# Patient Record
Sex: Male | Born: 1975 | Race: White | Hispanic: No | Marital: Married | Smoking: Never smoker
Health system: Southern US, Community
[De-identification: ages and names within clinical notes are randomized; demographics above are authoritative.]

## PROBLEM LIST (undated history)

## (undated) DIAGNOSIS — M5030 Other cervical disc degeneration, unspecified cervical region: Secondary | ICD-10-CM

## (undated) DIAGNOSIS — G709 Myoneural disorder, unspecified: Secondary | ICD-10-CM

## (undated) DIAGNOSIS — M1990 Unspecified osteoarthritis, unspecified site: Secondary | ICD-10-CM

## (undated) DIAGNOSIS — G4730 Sleep apnea, unspecified: Secondary | ICD-10-CM

## (undated) DIAGNOSIS — I10 Essential (primary) hypertension: Secondary | ICD-10-CM

## (undated) DIAGNOSIS — C801 Malignant (primary) neoplasm, unspecified: Secondary | ICD-10-CM

---

## 2003-12-26 ENCOUNTER — Ambulatory Visit (HOSPITAL_COMMUNITY): Admission: RE | Admit: 2003-12-26 | Discharge: 2003-12-27 | Payer: Self-pay | Admitting: Neurosurgery

## 2005-02-16 ENCOUNTER — Ambulatory Visit (HOSPITAL_COMMUNITY): Admission: RE | Admit: 2005-02-16 | Discharge: 2005-02-16 | Payer: Self-pay | Admitting: Neurosurgery

## 2007-12-22 ENCOUNTER — Emergency Department: Payer: Self-pay | Admitting: Emergency Medicine

## 2010-07-13 ENCOUNTER — Encounter (HOSPITAL_COMMUNITY)
Admission: RE | Admit: 2010-07-13 | Discharge: 2010-07-13 | Disposition: A | Discharge status: Home / Self Care | Payer: Worker's Compensation | Source: Ambulatory Visit | Attending: Neurosurgery | Admitting: Neurosurgery

## 2010-07-13 DIAGNOSIS — Z01812 Encounter for preprocedural laboratory examination: Secondary | ICD-10-CM | POA: Insufficient documentation

## 2010-07-13 LAB — BASIC METABOLIC PANEL
CO2: 27 mEq/L (ref 19–32)
Calcium: 10.1 mg/dL (ref 8.4–10.5)
Creatinine, Ser: 1.09 mg/dL (ref 0.4–1.5)
GFR calc Af Amer: >60 mL/min (ref >60)
GFR calc non Af Amer: >60 mL/min (ref >60)
Glucose, Bld: 105 mg/dL — ABNORMAL HIGH (ref 70–99)

## 2010-07-13 LAB — CBC
HCT: 44.3 % (ref 39.0–52.0)
Hemoglobin: 15.1 g/dL (ref 13.0–17.0)
MCH: 29 pg (ref 26.0–34.0)
MCHC: 34.1 g/dL (ref 30.0–36.0)
MCV: 85.2 fL (ref 78.0–100.0)
Platelets: 302 10*3/uL (ref 150–400)

## 2010-07-13 LAB — SURGICAL PCR SCREEN: Staphylococcus aureus: NEGATIVE

## 2010-07-18 ENCOUNTER — Ambulatory Visit (HOSPITAL_COMMUNITY)
Admission: RE | Admit: 2010-07-18 | Discharge: 2010-07-18 | Disposition: A | Discharge status: Home / Self Care | Payer: Worker's Compensation | Source: Ambulatory Visit | Attending: Neurosurgery | Admitting: Neurosurgery

## 2010-07-18 ENCOUNTER — Other Ambulatory Visit (HOSPITAL_COMMUNITY): Payer: Self-pay | Admitting: Neurosurgery

## 2010-07-18 ENCOUNTER — Inpatient Hospital Stay (HOSPITAL_COMMUNITY)
Admission: RE | Admit: 2010-07-18 | Discharge: 2010-07-19 | DRG: 472 | Disposition: A | Discharge status: Home / Self Care | Payer: Worker's Compensation | Source: Ambulatory Visit | Attending: Neurosurgery | Admitting: Neurosurgery

## 2010-07-18 ENCOUNTER — Ambulatory Visit (HOSPITAL_COMMUNITY): Payer: Worker's Compensation

## 2010-07-18 DIAGNOSIS — M5000 Cervical disc disorder with myelopathy, unspecified cervical region: Principal | ICD-10-CM | POA: Diagnosis present

## 2010-07-18 DIAGNOSIS — S13151A Dislocation of C4/C5 cervical vertebrae, initial encounter: Secondary | ICD-10-CM | POA: Diagnosis present

## 2010-07-18 DIAGNOSIS — M4830 Traumatic spondylopathy, site unspecified: Secondary | ICD-10-CM | POA: Diagnosis present

## 2010-07-18 DIAGNOSIS — M542 Cervicalgia: Secondary | ICD-10-CM

## 2010-07-18 DIAGNOSIS — S13161A Dislocation of C5/C6 cervical vertebrae, initial encounter: Secondary | ICD-10-CM | POA: Diagnosis present

## 2010-07-18 DIAGNOSIS — Z981 Arthrodesis status: Secondary | ICD-10-CM

## 2010-07-18 DIAGNOSIS — X58XXXA Exposure to other specified factors, initial encounter: Secondary | ICD-10-CM | POA: Diagnosis present

## 2010-07-19 NOTE — Op Note (Signed)
Rodney [name], Rodney [name]              ACCOUNT NO.:  [id number]  MEDICAL RECORD NO.:  [id number]           PATIENT TYPE:  I  LOCATION:  3526                         FACILITY:  [facility]  PHYSICIAN:  Cristi Loron, M.D.DATE OF BIRTH:  [birth year]  DATE OF PROCEDURE:  07/18/2010 DATE OF DISCHARGE:                              OPERATIVE REPORT   BRIEF HISTORY:  The patient is a 35 year old white male who I performed a C6-7 anterior cervical diskectomy and fusion plating years ago.  The patient initially did well, but was involved in a work-related injury suffering recurrence of his neck and arm pain.  He failed medical management, was worked up with a cervical MRI which demonstrated he had significant herniated disk, spondylosis, and stenosis at C4-5 and C5-6. I discussed the various treatment options with the patient and I recommended that he undergo the C4-5, C5-6 anterior cervical diskectomy and fusion plating.  The patient has weighed the risks, benefits, and alternatives of the surgery, decided to proceed with the operation.  PREOPERATIVE DIAGNOSES:  C4-5 and C5-6 herniated nucleus pulposus, spondylosis, stenosis, cervical myelopathy/radiculopathy, cervicalgia.  POSTOPERATIVE DIAGNOSES:  C4-5 and C5-6 herniated nucleus pulposus, spondylosis, stenosis, cervical myelopathy/radiculopathy, cervicalgia.  PROCEDURE:  C4-5 and C5-6 extensive anterior cervical diskectomy/decompression; C4-5 and C5-6 anterior interbody arthrodesis with local morselized autograft bone and Actifuse bone graft extender; insertion of C4-5 and C5-6 interbody prosthesis (Zimmer PEEK interbody prosthesis); C4-C6 anterior cervical instrumentation with Globus titanium plate and screws.  SURGEON:  Cristi Loron, M.D.  ASSISTANT:  Hewitt Shorts, M.D.  ANESTHESIA:  General endotracheal.  ESTIMATED BLOOD LOSS:  100 mL.  SPECIMENS:  None.  DRAINS:  None.  COMPLICATIONS:  None.  DESCRIPTION OF THE  PROCEDURE:  The patient was brought to the operating room by the anesthesia team.  General endotracheal anesthesia was induced.  The patient remained in supine position.  A roll was placed under the shoulders to keep his neck in the neutral alignment.  His anterior cervical region was then prepared with Betadine scrub and Betadine solution.  Sterile drapes were applied.  I then injected the area to be incised with Marcaine with epinephrine solution.  A scalpel to make a transverse incision on the patient's left anterior neck.  I used a Metzenbaum scissors to divide the platysma muscle and then to dissect the medial to sternocleidomastoid muscle, jugular vein, and carotid artery.  We did encounter some scar tissue from the prior operation.  We carefully dissected through it.  We identified the esophagus and retracted it medially.  We then used Kittner swabs to clear the soft tissue from the anterior cervical spine.  We identified the anterior cervical plate at Z6-1 confirming our location.  We incised the C5-6 disk with a 15 blade scalpel and performed a partial intervertebral diskectomy with the pituitary forceps and Carlen curettes.  We then inserted distraction screws into the C5 vertebral body.  We removed one of the screws from the old plate at W9-6 and placed a second distraction screw through this hole on the plate.  We did distract the interspace at C5-6.  We then used a  high-speed drill to decorticate the vertebral endplates at C5-6, to drill away the remainder of C5-6 intervertebral disk, to drill away some posterior spondylosis, and to thin out the posterior longitudinal ligament.  We then incised the ligament with Arachnoid Knife and removed it with Kerrison punch undercutting the vertebral endplates at C5-6 decompressing the thecal sac.  We then performed wide foraminotomies about the bilateral C6 nerve root completing the decompression at this level.  There was quite a bit of  spondylosis here.  We then repeated the procedure in analogous fashion at C4-5 decompressing the C4-5 thecal sac and C5 nerve root.  Of note, at this level there was more of an acute i.e., soft ruptured disk.  We got a good decompression at both levels.  We now turned our attention to the arthrodesis.  We used trial spacers and determined to use an 8 x 14 x 14 mm PEEK interbody prosthesis at C4- 5 and a 7 x 14 x 14 at C5-6.  We prefilled the prosthesis with combination of local morselized autograft bone that we obtained during the decompression as well as Actifuse bone graft extender.  We inserted the prosthesis into the distracted interspaces.  We then removed the distraction screws.  There was a good snug fit of the prosthesis at both levels.  We now turned our attention to the anterior spinal instrumentation.  We used high-speed drill to remove some ventral spondylosis from the vertebral endplates at C4-5 and C5-6, so that the plate would lie down flat.  We selected appropriate length Globus anterior plate and laid along the anterior aspect of the vertebral bodies from C4-C6.  We then drilled two 14-mm holes at C4, C5, and C6 (We were able to place the plate in tandem at the old plate at C6).  We then secured the plate to the vertebral bodies by placing two 12-mm self-tapping screws at C4, C5, and C6.  We got good bony purchase.  We then obtained intraoperative radiograph.  There was limited visualization because of the patient's body habitus and shoulder, but the construct looked good in vivo.  We therefore secured the screws to the plate by locking each cam.  This completed the instrumentation.  We then obtained hemostasis using bipolar electrocautery.  We irrigated the wound out with bacitracin solution.  We then removed the retractors. We inspected the esophagus for any damage, there was none apparent.  We then reapproximated the patient's platysma muscle with interrupted  3-0 Vicryl suture, subcutaneous tissue with interrupted 3-0 Vicryl suture, and the skin with Steri-Strips and Benzoin.  The wound was then coated with bacitracin ointment.  A sterile dressing was applied.  The drapes were removed and the patient was subsequently extubated by the anesthesia team and transported to postanesthesia care unit in stable condition.  All sponge, instrument, and needle counts were correct at the end of this case.     Cristi Loron, M.D.    JDJ/MEDQ  D:  07/18/2010  T:  07/19/2010  Job:  [id number]  Electronically Signed by Tressie Stalker M.D. on 07/19/2010 12:23:11 PM

## 2010-07-22 ENCOUNTER — Emergency Department (HOSPITAL_COMMUNITY): Payer: Worker's Compensation

## 2010-07-22 ENCOUNTER — Inpatient Hospital Stay (HOSPITAL_COMMUNITY)
Admission: EM | Admit: 2010-07-22 | Discharge: 2010-07-24 | DRG: 392 | Disposition: A | Discharge status: Home / Self Care | Payer: Worker's Compensation | Attending: Neurosurgery | Admitting: Neurosurgery

## 2010-07-22 DIAGNOSIS — M549 Dorsalgia, unspecified: Secondary | ICD-10-CM | POA: Diagnosis present

## 2010-07-22 DIAGNOSIS — R1313 Dysphagia, pharyngeal phase: Principal | ICD-10-CM | POA: Diagnosis present

## 2010-07-22 DIAGNOSIS — Z981 Arthrodesis status: Secondary | ICD-10-CM

## 2010-07-22 LAB — BASIC METABOLIC PANEL
BUN: 11 mg/dL (ref 6–23)
Calcium: 9.8 mg/dL (ref 8.4–10.5)
Creatinine, Ser: 0.97 mg/dL (ref 0.4–1.5)
GFR calc Af Amer: >60 mL/min (ref >60)
Sodium: 135 mEq/L (ref 135–145)

## 2010-07-22 LAB — CBC
HCT: 41.4 % (ref 39.0–52.0)
Hemoglobin: 14.1 g/dL (ref 13.0–17.0)
MCH: 28.4 pg (ref 26.0–34.0)
MCHC: 34.1 g/dL (ref 30.0–36.0)
MCV: 83.3 fL (ref 78.0–100.0)
Platelets: 328 10*3/uL (ref 150–400)
RDW: 13.1 % (ref 11.5–15.5)

## 2010-07-22 LAB — DIFFERENTIAL
Eosinophils Absolute: 0.1 10*3/uL (ref 0.0–0.7)
Neutro Abs: 11.5 10*3/uL — ABNORMAL HIGH (ref 1.7–7.7)

## 2010-07-24 ENCOUNTER — Inpatient Hospital Stay (HOSPITAL_COMMUNITY): Payer: Worker's Compensation

## 2010-07-24 LAB — DIFFERENTIAL
Eosinophils Absolute: 0 10*3/uL (ref 0.0–0.7)
Eosinophils Relative: 0 % (ref 0–5)
Monocytes Relative: 4 % (ref 3–12)
Neutro Abs: 15.9 10*3/uL — ABNORMAL HIGH (ref 1.7–7.7)
Neutrophils Relative %: 90 % — ABNORMAL HIGH (ref 43–77)

## 2010-07-24 LAB — CBC: Platelets: 380 10*3/uL (ref 150–400)

## 2010-08-16 NOTE — H&P (Signed)
[name], [name]              ACCOUNT NO.:  [id number]  MEDICAL RECORD NO.:  [id number]           PATIENT TYPE:  I  LOCATION:  5040                         FACILITY:  [facility]  PHYSICIAN:  Coletta Memos, M.D.     DATE OF BIRTH:  1976/05/12  DATE OF ADMISSION:  07/22/2010 DATE OF DISCHARGE:                             HISTORY & PHYSICAL   ADMISSION DIAGNOSIS:  Dysphagia.  INDICATION:  Rodney [name] is a 35 year old gentleman who is a patient of Dr. Tressie Stalker.  He recently underwent an anterior cervical decompression and arthrodesis at C4-5 and C5-6 on July 19, 2010.  He was discharged home on July 20, 2010.  At time of discharge, he was able to swallow but did have some mild difficulty.  His wife called today stating that he is having great difficulty with swallowing.  Today he had no problems with speaking.  He did not feel short of breath.  He says he has been coughing up a lot of mucus and this was ongoing over the last 24-36 hours.  I therefore requested and he agreed to come to the emergency room.  PAST MEDICAL HISTORY:  Significant for chronic low back pain.  SOCIAL HISTORY:  He does not use illicit drugs.  He does not use alcohol.  He does not smoke.  PAST SURGICAL HISTORY:  He had surgery in 2005 at C6-7 performed also by Dr. Tressie Stalker and returns today.  MEDICATIONS ON ADMISSION:  Percocet, Valium, Opana, Skelaxin and naproxen sodium.  ALLERGIES:  He has no known drug allergies.  No other medical problems.  FAMILY HISTORY:  Diabetes.  Mother and father both alive.  He is currently taking OxyContin 15 mg q.12 and Percocet 7.5/325 one-two q.4 h. as needed.  He also takes Valium 5 mg p.o. q.6 p.r.n. spasms.  REVIEW OF SYSTEMS:  Positive for dysphagia, neck and upper extremity pain.  PHYSICAL EXAMINATION:  VITAL SIGNS:  Temperature of 98.1, blood pressure 152/89, pulse 107, respiratory rate 16, and oxygen saturation is 98% on air. GENERAL:   He is alert and oriented x4.  Answering all questions appropriately.  Memory length, attention span, and fund of knowledge are normal.  Speech is clear and fluent.  Pupils equal, round, reactive to light.  Full extraocular movements.  Full visual fields.  Funduscopic exam shows sharp optic disks.  Venous pulsations are present.  He has symmetric facial movements and sensation.  Hearing intact to finger rub bilaterally.  Uvula elevates midline.  Shoulder shrug is normal.  Tongue protrudes in midline.  5/5 strength in both the upper and lower extremities.  Normal muscle tone, bulk, and coordination.  Reflexes not assessed.  He has intact to light touch and intact proprioception.  Gait is normal.  No cervical masses or bruits.  The wound is clean, dry, well healed, no significant edema appreciated.  Soft, nontender. HEART:  Regular rhythm and rate.  No murmurs or rubs are appreciated. No clubbing, cyanosis, or edema.  Pulses good at the wrists bilaterally. HEENT:  Normocephalic and atraumatic.  Sclerae not injected.  Plain x-rays, cervical spine reviewed.  These show some  prevertebral soft tissue swelling to be expected only 4 days postop.  Airway is visible.  His speaking voice is normal.  Not strange nor high-pitched.  [name] comes in today secondary to dysphagia.  He has problems which have been increasingly worse since discharge.  I will admit him, place him on Decadron to see if that will reduce the swelling.  I do not think he needs operating room at this point in time.  His airway is certainly patent.  His hardware is in good position and seen on the x- rays, no complications appreciated from that,  certainly no evidence at this time of infection.          ______________________________ Coletta Memos, M.D.     KC/MEDQ  D:  07/22/2010  T:  07/23/2010  Job:  [id number]  Electronically Signed by Coletta Memos M.D. on 08/16/2010 02:33:34 PM

## 2010-08-27 NOTE — Discharge Summary (Signed)
  [name], Rodney [name]              ACCOUNT NO.:  [id number]  MEDICAL RECORD NO.:  [id number]           PATIENT TYPE:  I  LOCATION:  5040                         FACILITY:  [facility]  PHYSICIAN:  Cristi Loron, M.D.DATE OF BIRTH:  1976-02-14  DATE OF ADMISSION:  07/22/2010 DATE OF DISCHARGE:  07/24/2010                              DISCHARGE SUMMARY   BRIEF HISTORY:  The patient is a 35 year old white male who I performed anterior cervical diskectomy, fusion, and plating on July 18, 2010. The patient was discharged on July 19, 2010.  He developed some progressive dysphagia and the patient was readmitted by Dr. Franky Macho for observation.  For further details of this admission, please refer to Dr. Sueanne Margarita admission note.  HOSPITAL COURSE:  The patient was observed over the next few days and his dysphagia improved significantly.  By February 2012, the patient was afebrile, vital signs stable.  He was swallowing "good" and trusted to be discharged home with followup schedule with me.  DISCHARGE INSTRUCTIONS:  The patient was instructed to follow up with me in a week.  FINAL DIAGNOSIS:  Dysphagia.  PROCEDURE PERFORMED:  Modified barium swallow.     Cristi Loron, M.D.     JDJ/MEDQ  D:  08/20/2010  T:  08/21/2010  Job:  [id number]  Electronically Signed by Tressie Stalker M.D. on 08/27/2010 07:33:01 AM

## 2012-08-29 IMAGING — CR DG NECK SOFT TISSUE
1 series · 1 of 1 positions shown · non-contrast
Comparison: 07/18/2010

CLINICAL DATA: Recent cervical surgery.  Pain and difficulty
swallowing.

NECK SOFT TISSUES - 1+ VIEW

[w soft tissue neck]
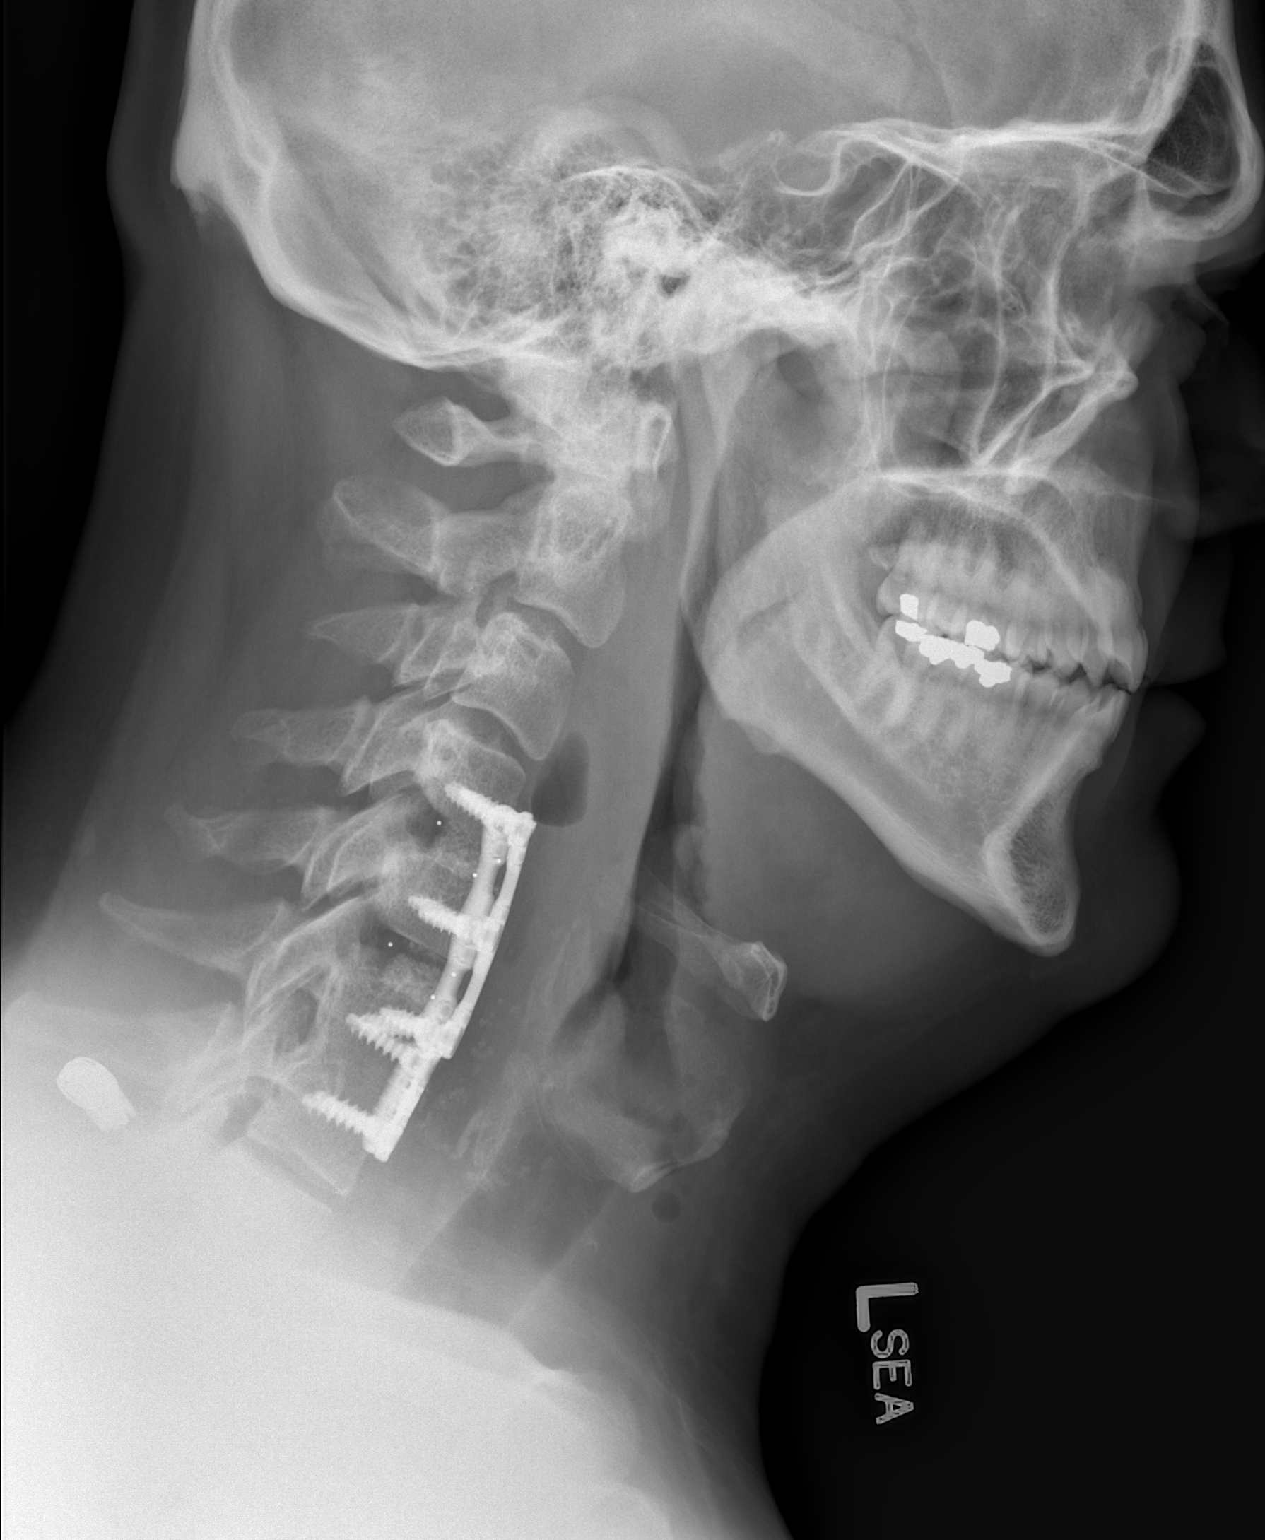

[1 of 1 positions shown; findings below may reference images not displayed]

FINDINGS: There has been previous anterior cervical discectomy and
fusion from C4-C7.  The C6-7 fusion appears to be old.  The C4-C6
fusion appears recent.  Interbody fusion material appears well
positioned.  There is an anterior plate with screw fixation.  There
is retropharyngeal/prevertebral soft tissue swelling as is commonly
seen 4 days postoperatively.  There is an air or gas collection
anterior to the C3-4 level.  The findings could represent sterile
postoperative change with swelling and/or hematoma formation.
However, the possibility of prevertebral/retropharyngeal
infection/abscess does certainly exist.  Air  associated with the
surgical procedure tends to resolve after a few days.
IMPRESSION: Prevertebral swelling and air / gas collection.  See above
discussion.

## 2014-11-11 ENCOUNTER — Emergency Department
Admission: EM | Admit: 2014-11-11 | Discharge: 2014-11-11 | Disposition: A | Discharge status: Home / Self Care | Payer: Managed Care, Other (non HMO) | Attending: Emergency Medicine | Admitting: Emergency Medicine

## 2014-11-11 DIAGNOSIS — Z7952 Long term (current) use of systemic steroids: Secondary | ICD-10-CM | POA: Diagnosis not present

## 2014-11-11 DIAGNOSIS — G8929 Other chronic pain: Secondary | ICD-10-CM | POA: Diagnosis not present

## 2014-11-11 DIAGNOSIS — R350 Frequency of micturition: Secondary | ICD-10-CM | POA: Diagnosis not present

## 2014-11-11 DIAGNOSIS — Z9889 Other specified postprocedural states: Secondary | ICD-10-CM | POA: Insufficient documentation

## 2014-11-11 DIAGNOSIS — M549 Dorsalgia, unspecified: Secondary | ICD-10-CM | POA: Diagnosis not present

## 2014-11-11 LAB — URINALYSIS COMPLETE WITH MICROSCOPIC (ARMC ONLY)
BILIRUBIN URINE: NEGATIVE
Bacteria, UA: NONE SEEN
GLUCOSE, UA: NEGATIVE mg/dL
HGB URINE DIPSTICK: NEGATIVE
Ketones, ur: NEGATIVE mg/dL
LEUKOCYTES UA: NEGATIVE
NITRITE: NEGATIVE
PH: 5 (ref 5.0–8.0)
Protein, ur: NEGATIVE mg/dL
RBC / HPF: NONE SEEN RBC/hpf (ref 0–5)
SQUAMOUS EPITHELIAL / LPF: NONE SEEN
Specific Gravity, Urine: 1.019 (ref 1.005–1.030)

## 2014-11-11 MED ORDER — KETOROLAC TROMETHAMINE 60 MG/2ML IM SOLN
60.0000 mg | Freq: Once | INTRAMUSCULAR | Status: AC
  Administered 2014-11-11: 60 mg via INTRAMUSCULAR

## 2014-11-11 MED ORDER — PREDNISONE 10 MG PO TABS: ORAL_TABLET | ORAL | Status: DC

## 2014-11-11 MED ORDER — HYDROCODONE-ACETAMINOPHEN 5-325 MG PO TABS: 1.0000 | ORAL_TABLET | ORAL | Status: DC | PRN

## 2014-11-11 MED ORDER — KETOROLAC TROMETHAMINE 60 MG/2ML IM SOLN
INTRAMUSCULAR | Status: AC
  Administered 2014-11-11: 60 mg via INTRAMUSCULAR
  Filled 2014-11-11: qty 2

## 2014-11-11 NOTE — Discharge Instructions (Signed)
° °  KEEP YOUR APPOINTMENT WITH YOUR DOCTOR IN [place] TAKE MEDICATION AS DIRECTED

## 2014-11-11 NOTE — ED Notes (Signed)
Pt states he has numbness and tingling to all extremities with back pain.  States has symptoms intermittently since 2012 after having spinal fusion.

## 2014-11-11 NOTE — ED Notes (Signed)
States he has had spine surgery   Developed some numbness to legs and arms recently  Thinks he may have another disc problem.Marland Kitchenambulates well to treatment room  Grips equal ..

## 2014-11-11 NOTE — ED Provider Notes (Signed)
[facility] Emergency Department Provider Note  ____________________________________________  Time seen:7:19 AM I have reviewed the triage vital signs and the nursing notes.   HISTORY  Chief Complaint Back Pain   HPI Rodney [name] is a 39 y.o. male is here today with complaint of back pain. He states that he had spine surgery back in 2012 and has had problems off-and-on since.He states have an appointment with his neurosurgeon in [place] next month. He states he is not have any pain medication at home. There is some numbness and tingling to "all his extremities" and states that this occurred when he had disc problems in the past. He rates his pain as a 6 out of 10. Nothing has improved his pain standing,  bending and lifting seem  to make it worse. He denies any bowel or bladder control loss. He continues to walk without any difficulty.   No past medical history on file.  There are no active problems to display for this patient.   No past surgical history on file.  Current Outpatient Rx  Name  Route  Sig  Dispense  Refill  . HYDROcodone-acetaminophen (NORCO/VICODIN) 5-325 MG per tablet   Oral   Take 1 tablet by mouth every 4 (four) hours as needed for moderate pain.   15 tablet   0   . predniSONE (DELTASONE) 10 MG tablet      Take 6 tablets  today, on day 2 take 5 tablets, day 3 take 4 tablets, day 4 take 3 tablets, day 5 take  2 tablets and 1 tablet the last day   21 tablet   0     Allergies Review of patient's allergies indicates no known allergies.  No family history on file.  Social History History  Substance Use Topics  . Smoking status: Not on file  . Smokeless tobacco: Not on file  . Alcohol Use: Not on file    Review of Systems Constitutional: No fever/chills Eyes: No visual changes. ENT: No sore throat. Cardiovascular: Denies chest pain. Respiratory: Denies shortness of breath. Gastrointestinal: No abdominal pain.  No  nausea, no vomiting. Genitourinary: Positive for urinary frequency Musculoskeletal: Positive for chronic back pain Skin: Negative for rash. Neurological: Negative for headaches, focal weakness or numbness generalized upper and lower extremities.  10-point ROS otherwise negative.  ____________________________________________   PHYSICAL EXAM:  VITAL SIGNS: ED Triage Vitals  Enc Vitals Group     BP 11/11/14 0618 153/87 mmHg     Pulse Rate 11/11/14 0618 84     Resp 11/11/14 0618 18     Temp 11/11/14 0618 97.7 F (36.5 C)     Temp Source 11/11/14 0618 Oral     SpO2 11/11/14 0618 100 %     Weight 11/11/14 0618 275 lb (124.739 kg)     Height 11/11/14 0618 6\' 3"  (1.905 m)     Head Cir --      Peak Flow --      Pain Score 11/11/14 0619 6     Pain Loc --      Pain Edu? --      Excl. in [place]? --     Constitutional: Alert and oriented. Well appearing and in no acute distress. Eyes: Conjunctivae are normal. PERRL. EOMI. Head: Atraumatic. Nose: No congestion/rhinnorhea. Neck: No stridor.  Nontender to palpation of the cervical spine posteriorly. Hematological/Lymphatic/Immunilogical: No cervical lymphadenopathy. Cardiovascular: Normal rate, regular rhythm. Grossly normal heart sounds.  Good peripheral circulation. Respiratory: Normal respiratory effort.  No retractions. Lungs CTAB. Gastrointestinal: Soft and nontender. No distention. No abdominal bruits. No CVA tenderness. Musculoskeletal: Back exam shows no gross deformity. Range of motion is minimally guarded. Tenderness on palpation of the paravertebral muscles mid thoracic spine area. Patient has good grip strength in both hands as well as strength in his lower legs in comparison to the other leg. No lower extremity tenderness nor edema.  No joint effusions. Straight leg raise was approximate 70 bilaterally without difficulty Neurologic:  Normal speech and language. No gross focal neurologic deficits are appreciated. Speech is normal.  No gait instability. Reflexes 2+ bilaterally lower extremities. Normal gait was noted Skin:  Skin is warm, dry and intact. No rash noted. Psychiatric: Mood and affect are normal. Speech and behavior are normal.  ____________________________________________   LABS (all labs ordered are listed, but only abnormal results are displayed)  Labs Reviewed  URINALYSIS COMPLETEWITH MICROSCOPIC ([place]) - Abnormal; Notable for the following:    Color, Urine YELLOW (*)    APPearance CLEAR (*)    All other components within normal limits   _ PROCEDURES  Procedure(s) performed: None  Critical Care performed: No  ____________________________________________   INITIAL IMPRESSION / ASSESSMENT AND PLAN / ED COURSE  Pertinent labs & imaging results that were available during my care of the patient were reviewed by me and considered in my medical decision making (see chart for details).  Urinalysis was negative and patient was made aware that this is probably more muscle skeletal pain then urinary tract problems. He was started on a prednisone taper along with Norco No. 15 as needed for pain as directed. He is to keep his appointment with the neurosurgeon in [place] and call to see if there is any cancellations and appointments. ____________________________________________   FINAL CLINICAL IMPRESSION(S) / ED DIAGNOSES  Final diagnoses:  Exacerbation of chronic back pain      Johnn Hai, PA-C 11/11/14 [place], MD 11/11/14 1422

## 2015-03-07 ENCOUNTER — Other Ambulatory Visit: Payer: Self-pay | Admitting: Orthopedic Surgery

## 2015-03-07 DIAGNOSIS — R2232 Localized swelling, mass and lump, left upper limb: Secondary | ICD-10-CM

## 2015-03-10 ENCOUNTER — Ambulatory Visit
Admission: RE | Admit: 2015-03-10 | Discharge: 2015-03-10 | Disposition: A | Discharge status: Home / Self Care | Payer: Managed Care, Other (non HMO) | Source: Ambulatory Visit | Attending: Orthopedic Surgery | Admitting: Orthopedic Surgery

## 2015-03-10 DIAGNOSIS — R2232 Localized swelling, mass and lump, left upper limb: Secondary | ICD-10-CM | POA: Diagnosis not present

## 2015-03-10 DIAGNOSIS — R600 Localized edema: Secondary | ICD-10-CM | POA: Diagnosis not present

## 2015-03-10 DIAGNOSIS — M19042 Primary osteoarthritis, left hand: Secondary | ICD-10-CM | POA: Diagnosis not present

## 2015-03-10 MED ORDER — GADOBENATE DIMEGLUMINE 529 MG/ML IV SOLN
20.0000 mL | Freq: Once | INTRAVENOUS | Status: AC | PRN
  Administered 2015-03-10: 20 mL via INTRAVENOUS

## 2015-03-15 ENCOUNTER — Ambulatory Visit: Payer: 59

## 2016-03-05 ENCOUNTER — Other Ambulatory Visit: Payer: Self-pay | Admitting: Neurosurgery

## 2016-03-05 DIAGNOSIS — M5416 Radiculopathy, lumbar region: Secondary | ICD-10-CM

## 2016-03-05 DIAGNOSIS — M5412 Radiculopathy, cervical region: Secondary | ICD-10-CM

## 2016-03-20 ENCOUNTER — Other Ambulatory Visit: Payer: Self-pay

## 2016-03-20 ENCOUNTER — Other Ambulatory Visit: Payer: Worker's Compensation

## 2016-04-29 ENCOUNTER — Other Ambulatory Visit: Payer: Self-pay

## 2016-04-29 ENCOUNTER — Ambulatory Visit
Admission: RE | Admit: 2016-04-29 | Discharge: 2016-04-29 | Disposition: A | Discharge status: Home / Self Care | Payer: Self-pay | Source: Ambulatory Visit | Attending: Neurosurgery | Admitting: Neurosurgery

## 2016-04-29 ENCOUNTER — Ambulatory Visit
Admission: RE | Admit: 2016-04-29 | Discharge: 2016-04-29 | Disposition: A | Discharge status: Home / Self Care | Payer: No Typology Code available for payment source | Source: Ambulatory Visit | Attending: Neurosurgery | Admitting: Neurosurgery

## 2016-04-29 DIAGNOSIS — M5416 Radiculopathy, lumbar region: Secondary | ICD-10-CM

## 2016-04-29 DIAGNOSIS — M5412 Radiculopathy, cervical region: Secondary | ICD-10-CM

## 2016-04-29 MED ORDER — ONDANSETRON HCL 4 MG/2ML IJ SOLN: 4.0000 mg | Freq: Four times a day (QID) | INTRAMUSCULAR | Status: DC | PRN

## 2016-04-29 MED ORDER — DIAZEPAM 5 MG PO TABS
10.0000 mg | ORAL_TABLET | Freq: Once | ORAL | Status: AC
  Administered 2016-04-29: 10 mg via ORAL

## 2016-04-29 MED ORDER — IOPAMIDOL (ISOVUE-M 300) INJECTION 61%
10.0000 mL | Freq: Once | INTRAMUSCULAR | Status: AC | PRN
  Administered 2016-04-29: 10 mL via INTRATHECAL

## 2016-04-29 NOTE — Discharge Instructions (Signed)

## 2016-12-19 ENCOUNTER — Inpatient Hospital Stay: Admit: 2016-12-19 | Payer: Self-pay | Admitting: Neurosurgery

## 2016-12-19 SURGERY — POSTERIOR CERVICAL FUSION/FORAMINOTOMY LEVEL 1
Anesthesia: General

## 2018-12-01 ENCOUNTER — Other Ambulatory Visit: Payer: Self-pay | Admitting: Neurosurgery

## 2018-12-02 ENCOUNTER — Encounter
Admission: RE | Admit: 2018-12-02 | Discharge: 2018-12-02 | Disposition: A | Discharge status: Home / Self Care | Payer: Medicare Other | Source: Ambulatory Visit | Attending: Neurosurgery | Admitting: Neurosurgery

## 2018-12-02 ENCOUNTER — Other Ambulatory Visit: Payer: Self-pay

## 2018-12-02 DIAGNOSIS — Z01818 Encounter for other preprocedural examination: Secondary | ICD-10-CM | POA: Diagnosis not present

## 2018-12-02 DIAGNOSIS — I10 Essential (primary) hypertension: Secondary | ICD-10-CM | POA: Insufficient documentation

## 2018-12-02 DIAGNOSIS — Z1159 Encounter for screening for other viral diseases: Secondary | ICD-10-CM | POA: Diagnosis not present

## 2018-12-02 HISTORY — DX: Myoneural disorder, unspecified: G70.9

## 2018-12-02 HISTORY — DX: Unspecified osteoarthritis, unspecified site: M19.90

## 2018-12-02 HISTORY — DX: Other cervical disc degeneration, unspecified cervical region: M50.30

## 2018-12-02 HISTORY — DX: Essential (primary) hypertension: I10

## 2018-12-02 HISTORY — DX: Sleep apnea, unspecified: G47.30

## 2018-12-02 HISTORY — DX: Malignant (primary) neoplasm, unspecified: C80.1

## 2018-12-02 NOTE — Patient Instructions (Addendum)
Your procedure is scheduled on: Mon 12/07/18 Report to [facility] ON 2ND FLOOR MEDICAL MALL ENTRANCE. To find out your arrival time please call [phone] between 1PM - 3PM on Fri 12/04/18.  Remember: Instructions that are not followed completely may result in serious medical risk, up to and including death, or upon the discretion of your surgeon and anesthesiologist your surgery may need to be rescheduled.     _X__ 1. Do not eat food after midnight the night before your procedure.                 No gum chewing or hard candies. You may drink clear liquids up to 2 hours                 before you are scheduled to arrive for your surgery- DO not drink clear                 liquids within 2 hours of the start of your surgery.                 Clear Liquids include:  water, apple juice without pulp, clear carbohydrate                 drink such as Clearfast or Gatorade, Black Coffee or Tea (Do not add                 anything to coffee or tea). Diabetics water only  __X__2.  On the morning of surgery brush your teeth with toothpaste and water, you                 may rinse your mouth with mouthwash if you wish.  Do not swallow any              toothpaste of mouthwash.     _X__ 3.  No Alcohol for 24 hours before or after surgery.   _X__ 4.  Do Not Smoke or use e-cigarettes For 24 Hours Prior to Your Surgery.                 Do not use any chewable tobacco products for at least 6 hours prior to                 surgery.  ____  5.  Bring all medications with you on the day of surgery if instructed.   __X__  6.  Notify your doctor if there is any change in your medical condition      (cold, fever, infections).     Do not wear jewelry, make-up, hairpins, clips or nail polish. Do not wear lotions, powders, or perfumes.  Do not shave 48 hours prior to surgery. Men may shave face and neck. Do not bring valuables to the hospital.    [facility] is not responsible for any  belongings or valuables.  Contacts, dentures/partials or body piercings may not be worn into surgery. Bring a case for your contacts, glasses or hearing aids, a denture cup will be supplied. Leave your suitcase in the car. After surgery it may be brought to your room. For patients admitted to the hospital, discharge time is determined by your treatment team.   Patients discharged the day of surgery will not be allowed to drive home.   Please read over the following fact sheets that you were given:   MRSA Information  __X__ Take these medicines the morning of surgery with A SIP OF WATER:  1. DULoxetine (CYMBALTA)  2. pregabalin (LYRICA)   3.   4.  5.  6.  ____ Fleet Enema (as directed)   __X__ Use CHG Soap/SAGE wipes as directed  ____ Use inhalers on the day of surgery  ____ Stop metformin/Janumet/Farxiga 2 days prior to surgery    ____ Take 1/2 of usual insulin dose the night before surgery. No insulin the morning          of surgery.   ____ Stop Blood Thinners Coumadin/Plavix/Xarelto/Pleta/Pradaxa/Eliquis/Effient/Aspirin  on   Or contact your Surgeon, Cardiologist or Medical Doctor regarding  ability to stop your blood thinners  __X__ Stop Anti-inflammatories 7 days before surgery such as Advil, Ibuprofen, Motrin,  BC or Goodies Powder, Naprosyn, Naproxen, Aleve, Aspirin    __X__ Stop all herbal supplements, fish oil or vitamin E until after surgery.    ____ Bring C-Pap to the hospital.     Telephone interview. Verbal instructions provided, patient verbalized understanding. To pick up written instructions [facility] labs EKG 12/02/18./cn

## 2018-12-03 ENCOUNTER — Encounter
Admission: RE | Admit: 2018-12-03 | Discharge: 2018-12-03 | Disposition: A | Discharge status: Home / Self Care | Payer: Medicare Other | Source: Ambulatory Visit | Attending: Neurosurgery | Admitting: Neurosurgery

## 2018-12-03 DIAGNOSIS — I10 Essential (primary) hypertension: Secondary | ICD-10-CM | POA: Diagnosis not present

## 2018-12-03 DIAGNOSIS — E785 Hyperlipidemia, unspecified: Secondary | ICD-10-CM | POA: Diagnosis not present

## 2018-12-03 DIAGNOSIS — Z87891 Personal history of nicotine dependence: Secondary | ICD-10-CM | POA: Diagnosis not present

## 2018-12-03 DIAGNOSIS — E669 Obesity, unspecified: Secondary | ICD-10-CM | POA: Diagnosis not present

## 2018-12-03 DIAGNOSIS — G4730 Sleep apnea, unspecified: Secondary | ICD-10-CM | POA: Diagnosis not present

## 2018-12-03 DIAGNOSIS — M79602 Pain in left arm: Secondary | ICD-10-CM | POA: Diagnosis not present

## 2018-12-03 DIAGNOSIS — M6250 Muscle wasting and atrophy, not elsewhere classified, unspecified site: Secondary | ICD-10-CM | POA: Diagnosis not present

## 2018-12-03 DIAGNOSIS — R200 Anesthesia of skin: Secondary | ICD-10-CM | POA: Diagnosis not present

## 2018-12-03 DIAGNOSIS — K219 Gastro-esophageal reflux disease without esophagitis: Secondary | ICD-10-CM | POA: Diagnosis not present

## 2018-12-03 DIAGNOSIS — M5030 Other cervical disc degeneration, unspecified cervical region: Secondary | ICD-10-CM | POA: Diagnosis not present

## 2018-12-03 DIAGNOSIS — G5622 Lesion of ulnar nerve, left upper limb: Secondary | ICD-10-CM | POA: Diagnosis present

## 2018-12-03 DIAGNOSIS — Z6834 Body mass index (BMI) 34.0-34.9, adult: Secondary | ICD-10-CM | POA: Diagnosis not present

## 2018-12-03 DIAGNOSIS — Z01818 Encounter for other preprocedural examination: Secondary | ICD-10-CM | POA: Diagnosis not present

## 2018-12-03 DIAGNOSIS — M1990 Unspecified osteoarthritis, unspecified site: Secondary | ICD-10-CM | POA: Diagnosis not present

## 2018-12-03 DIAGNOSIS — R531 Weakness: Secondary | ICD-10-CM | POA: Diagnosis not present

## 2018-12-03 LAB — URINALYSIS, ROUTINE W REFLEX MICROSCOPIC
Bilirubin Urine: NEGATIVE
Glucose, UA: NEGATIVE mg/dL
Hgb urine dipstick: NEGATIVE
Ketones, ur: NEGATIVE mg/dL
Leukocytes,Ua: NEGATIVE
Nitrite: NEGATIVE
Protein, ur: NEGATIVE mg/dL
Specific Gravity, Urine: 1.021 (ref 1.005–1.030)
pH: 5 (ref 5.0–8.0)

## 2018-12-03 LAB — TYPE AND SCREEN
ABO/RH(D): A POS
Antibody Screen: NEGATIVE

## 2018-12-03 LAB — APTT: aPTT: 30 seconds (ref 24–36)

## 2018-12-03 LAB — PROTIME-INR
INR: 1 (ref 0.8–1.2)
Prothrombin Time: 13.4 seconds (ref 11.4–15.2)

## 2018-12-04 LAB — SARS CORONAVIRUS 2 (TAT 6-24 HRS): SARS Coronavirus 2: NEGATIVE

## 2018-12-07 ENCOUNTER — Ambulatory Visit: Payer: Medicare Other | Admitting: Anesthesiology

## 2018-12-07 ENCOUNTER — Encounter
Admission: RE | Disposition: A | Discharge status: Home / Self Care | Payer: Self-pay | Source: Home / Self Care | Attending: Neurosurgery

## 2018-12-07 ENCOUNTER — Encounter: Payer: Self-pay | Admitting: *Deleted

## 2018-12-07 ENCOUNTER — Ambulatory Visit
Admission: RE | Admit: 2018-12-07 | Discharge: 2018-12-07 | Disposition: A | Discharge status: Home / Self Care | Payer: Medicare Other | Attending: Neurosurgery | Admitting: Neurosurgery

## 2018-12-07 ENCOUNTER — Other Ambulatory Visit: Payer: Self-pay

## 2018-12-07 DIAGNOSIS — Z6834 Body mass index (BMI) 34.0-34.9, adult: Secondary | ICD-10-CM | POA: Insufficient documentation

## 2018-12-07 DIAGNOSIS — R200 Anesthesia of skin: Secondary | ICD-10-CM | POA: Insufficient documentation

## 2018-12-07 DIAGNOSIS — M79602 Pain in left arm: Secondary | ICD-10-CM | POA: Insufficient documentation

## 2018-12-07 DIAGNOSIS — E669 Obesity, unspecified: Secondary | ICD-10-CM | POA: Insufficient documentation

## 2018-12-07 DIAGNOSIS — K219 Gastro-esophageal reflux disease without esophagitis: Secondary | ICD-10-CM | POA: Insufficient documentation

## 2018-12-07 DIAGNOSIS — G5622 Lesion of ulnar nerve, left upper limb: Secondary | ICD-10-CM | POA: Insufficient documentation

## 2018-12-07 DIAGNOSIS — M1990 Unspecified osteoarthritis, unspecified site: Secondary | ICD-10-CM | POA: Insufficient documentation

## 2018-12-07 DIAGNOSIS — Z87891 Personal history of nicotine dependence: Secondary | ICD-10-CM | POA: Insufficient documentation

## 2018-12-07 DIAGNOSIS — M5030 Other cervical disc degeneration, unspecified cervical region: Secondary | ICD-10-CM | POA: Insufficient documentation

## 2018-12-07 DIAGNOSIS — M6250 Muscle wasting and atrophy, not elsewhere classified, unspecified site: Secondary | ICD-10-CM | POA: Insufficient documentation

## 2018-12-07 DIAGNOSIS — G4730 Sleep apnea, unspecified: Secondary | ICD-10-CM | POA: Insufficient documentation

## 2018-12-07 DIAGNOSIS — R531 Weakness: Secondary | ICD-10-CM | POA: Insufficient documentation

## 2018-12-07 DIAGNOSIS — E785 Hyperlipidemia, unspecified: Secondary | ICD-10-CM | POA: Insufficient documentation

## 2018-12-07 DIAGNOSIS — I10 Essential (primary) hypertension: Secondary | ICD-10-CM | POA: Insufficient documentation

## 2018-12-07 HISTORY — PX: ANTERIOR INTEROSSEOUS NERVE DECOMPRESSION: SHX5735

## 2018-12-07 LAB — ABO/RH: ABO/RH(D): A POS

## 2018-12-07 SURGERY — ANTERIOR INTEROSSEOUS NERVE DECOMPRESSION
Anesthesia: General | Site: Arm Upper | Laterality: Left

## 2018-12-07 MED ORDER — PROMETHAZINE HCL 25 MG/ML IJ SOLN: 6.2500 mg | INTRAMUSCULAR | Status: DC | PRN

## 2018-12-07 MED ORDER — KETOROLAC TROMETHAMINE 30 MG/ML IJ SOLN
INTRAMUSCULAR | Status: DC | PRN
  Administered 2018-12-07: 30 mg via INTRAVENOUS

## 2018-12-07 MED ORDER — PHENYLEPHRINE HCL (PRESSORS) 10 MG/ML IV SOLN
INTRAVENOUS | Status: DC | PRN
  Administered 2018-12-07 (×2): 50 ug via INTRAVENOUS

## 2018-12-07 MED ORDER — LIDOCAINE HCL (PF) 2 % IJ SOLN
INTRAMUSCULAR | Status: AC
  Filled 2018-12-07: qty 10

## 2018-12-07 MED ORDER — FENTANYL CITRATE (PF) 100 MCG/2ML IJ SOLN
INTRAMUSCULAR | Status: AC
  Filled 2018-12-07: qty 2

## 2018-12-07 MED ORDER — FENTANYL CITRATE (PF) 100 MCG/2ML IJ SOLN: 25.0000 ug | INTRAMUSCULAR | Status: DC | PRN

## 2018-12-07 MED ORDER — LIDOCAINE HCL 1 % IJ SOLN
INTRAMUSCULAR | Status: DC | PRN
  Administered 2018-12-07: 7 mL

## 2018-12-07 MED ORDER — MIDAZOLAM HCL 2 MG/2ML IJ SOLN
INTRAMUSCULAR | Status: DC | PRN
  Administered 2018-12-07: 2 mg via INTRAVENOUS

## 2018-12-07 MED ORDER — FAMOTIDINE 20 MG PO TABS
20.0000 mg | ORAL_TABLET | Freq: Once | ORAL | Status: AC
  Administered 2018-12-07: 20 mg via ORAL

## 2018-12-07 MED ORDER — ONDANSETRON HCL 4 MG/2ML IJ SOLN
INTRAMUSCULAR | Status: AC
  Filled 2018-12-07: qty 2

## 2018-12-07 MED ORDER — LIDOCAINE HCL (PF) 1 % IJ SOLN
INTRAMUSCULAR | Status: AC
  Filled 2018-12-07: qty 30

## 2018-12-07 MED ORDER — LACTATED RINGERS IV SOLN
INTRAVENOUS | Status: DC
  Administered 2018-12-07: 08:00:00 via INTRAVENOUS

## 2018-12-07 MED ORDER — GLYCOPYRROLATE 0.2 MG/ML IJ SOLN
INTRAMUSCULAR | Status: AC
  Filled 2018-12-07: qty 1

## 2018-12-07 MED ORDER — MEPERIDINE HCL 50 MG/ML IJ SOLN: 6.2500 mg | INTRAMUSCULAR | Status: DC | PRN

## 2018-12-07 MED ORDER — PROPOFOL 10 MG/ML IV BOLUS
INTRAVENOUS | Status: DC | PRN
  Administered 2018-12-07: 200 mg via INTRAVENOUS

## 2018-12-07 MED ORDER — ROCURONIUM BROMIDE 100 MG/10ML IV SOLN
INTRAVENOUS | Status: DC | PRN
  Administered 2018-12-07: 5 mg via INTRAVENOUS

## 2018-12-07 MED ORDER — ATROPINE SULFATE 0.4 MG/ML IJ SOLN
INTRAMUSCULAR | Status: DC | PRN
  Administered 2018-12-07: 0.2 mg via INTRAVENOUS

## 2018-12-07 MED ORDER — DEXAMETHASONE SODIUM PHOSPHATE 10 MG/ML IJ SOLN
INTRAMUSCULAR | Status: DC | PRN
  Administered 2018-12-07: 10 mg via INTRAVENOUS

## 2018-12-07 MED ORDER — ATROPINE SULFATE 0.4 MG/ML IJ SOLN
INTRAMUSCULAR | Status: AC
  Filled 2018-12-07: qty 1

## 2018-12-07 MED ORDER — ONDANSETRON HCL 4 MG/2ML IJ SOLN
INTRAMUSCULAR | Status: DC | PRN
  Administered 2018-12-07: 4 mg via INTRAVENOUS

## 2018-12-07 MED ORDER — CEFAZOLIN SODIUM-DEXTROSE 2-4 GM/100ML-% IV SOLN
INTRAVENOUS | Status: AC
  Filled 2018-12-07: qty 100

## 2018-12-07 MED ORDER — EPHEDRINE SULFATE 50 MG/ML IJ SOLN
INTRAMUSCULAR | Status: DC | PRN
  Administered 2018-12-07: 10 mg via INTRAVENOUS
  Administered 2018-12-07: 5 mg via INTRAVENOUS
  Administered 2018-12-07 (×2): 10 mg via INTRAVENOUS

## 2018-12-07 MED ORDER — OXYCODONE HCL 5 MG/5ML PO SOLN: 5.0000 mg | Freq: Once | ORAL | Status: DC | PRN

## 2018-12-07 MED ORDER — FAMOTIDINE 20 MG PO TABS
ORAL_TABLET | ORAL | Status: AC
  Filled 2018-12-07: qty 1

## 2018-12-07 MED ORDER — MIDAZOLAM HCL 2 MG/2ML IJ SOLN
INTRAMUSCULAR | Status: AC
  Filled 2018-12-07: qty 2

## 2018-12-07 MED ORDER — PROPOFOL 10 MG/ML IV BOLUS
INTRAVENOUS | Status: AC
  Filled 2018-12-07: qty 20

## 2018-12-07 MED ORDER — EPHEDRINE SULFATE 50 MG/ML IJ SOLN
INTRAMUSCULAR | Status: AC
  Filled 2018-12-07: qty 1

## 2018-12-07 MED ORDER — GLYCOPYRROLATE 0.2 MG/ML IJ SOLN
INTRAMUSCULAR | Status: DC | PRN
  Administered 2018-12-07: 0.2 mg via INTRAVENOUS

## 2018-12-07 MED ORDER — ROCURONIUM BROMIDE 50 MG/5ML IV SOLN
INTRAVENOUS | Status: AC
  Filled 2018-12-07: qty 1

## 2018-12-07 MED ORDER — FENTANYL CITRATE (PF) 100 MCG/2ML IJ SOLN
INTRAMUSCULAR | Status: DC | PRN
  Administered 2018-12-07: 50 ug via INTRAVENOUS
  Administered 2018-12-07 (×2): 25 ug via INTRAVENOUS

## 2018-12-07 MED ORDER — SUCCINYLCHOLINE CHLORIDE 20 MG/ML IJ SOLN
INTRAMUSCULAR | Status: DC | PRN
  Administered 2018-12-07: 120 mg via INTRAVENOUS

## 2018-12-07 MED ORDER — CEFAZOLIN SODIUM-DEXTROSE 2-4 GM/100ML-% IV SOLN
2.0000 g | Freq: Once | INTRAVENOUS | Status: AC
  Administered 2018-12-07: 2 g via INTRAVENOUS

## 2018-12-07 MED ORDER — OXYCODONE HCL 5 MG PO TABS: 5.0000 mg | ORAL_TABLET | Freq: Once | ORAL | Status: DC | PRN

## 2018-12-07 MED ORDER — LIDOCAINE HCL (CARDIAC) PF 100 MG/5ML IV SOSY
PREFILLED_SYRINGE | INTRAVENOUS | Status: DC | PRN
  Administered 2018-12-07: 100 mg via INTRAVENOUS

## 2018-12-07 MED ORDER — TRAMADOL HCL 50 MG PO TABS: 50.0000 mg | ORAL_TABLET | Freq: Four times a day (QID) | ORAL | 0 refills | Status: AC | PRN

## 2018-12-07 SURGICAL SUPPLY — 37 items
ADH SKN CLS APL DERMABOND .7 (GAUZE/BANDAGES/DRESSINGS)
APL PRP STRL LF DISP 70% ISPRP (MISCELLANEOUS) ×2
BNDG GAUZE 4.5X4.1 6PLY STRL (MISCELLANEOUS) ×3 IMPLANT
CANISTER SUCT 1200ML W/VALVE (MISCELLANEOUS) ×3 IMPLANT
CHLORAPREP W/TINT 26 (MISCELLANEOUS) ×6 IMPLANT
CORD BIP STRL DISP 12FT (MISCELLANEOUS) ×3 IMPLANT
COVER WAND RF STERILE (DRAPES) ×3 IMPLANT
DERMABOND ADVANCED (GAUZE/BANDAGES/DRESSINGS)
DERMABOND ADVANCED .7 DNX12 (GAUZE/BANDAGES/DRESSINGS) IMPLANT
DRAPE THYROID T SHEET (DRAPES) ×3 IMPLANT
ELECT CAUTERY BLADE TIP 2.5 (TIP) ×3
ELECTRODE CAUTERY BLDE TIP 2.5 (TIP) ×1 IMPLANT
FORCEPS JEWEL BIP 4-3/4 STR (INSTRUMENTS) ×3 IMPLANT
GAUZE SPONGE 4X4 12PLY STRL (GAUZE/BANDAGES/DRESSINGS) ×6 IMPLANT
GAUZE XEROFORM 1X8 LF (GAUZE/BANDAGES/DRESSINGS) ×3 IMPLANT
GLOVE BIOGEL PI IND STRL 7.0 (GLOVE) ×1 IMPLANT
GLOVE BIOGEL PI INDICATOR 7.0 (GLOVE) ×2
GLOVE INDICATOR 8.0 STRL GRN (GLOVE) ×3 IMPLANT
GLOVE SURG SYN 7.0 (GLOVE) ×6 IMPLANT
GLOVE SURG SYN 7.0 PF PI (GLOVE) ×2 IMPLANT
GLOVE SURG SYN 8.0 (GLOVE) ×3 IMPLANT
GLOVE SURG SYN 8.0 PF PI (GLOVE) ×1 IMPLANT
GOWN STRL REUS W/ TWL LRG LVL3 (GOWN DISPOSABLE) ×2 IMPLANT
GOWN STRL REUS W/ TWL XL LVL3 (GOWN DISPOSABLE) ×1 IMPLANT
GOWN STRL REUS W/TWL LRG LVL3 (GOWN DISPOSABLE) ×6
GOWN STRL REUS W/TWL XL LVL3 (GOWN DISPOSABLE) ×3
KIT TURNOVER KIT A (KITS) ×3 IMPLANT
NS IRRIG 1000ML POUR BTL (IV SOLUTION) ×3 IMPLANT
PACK EXTREMITY ARMC (MISCELLANEOUS) ×3 IMPLANT
STOCKINETTE 48X4 2 PLY STRL (GAUZE/BANDAGES/DRESSINGS) ×1 IMPLANT
STOCKINETTE STRL 4IN 9604848 (GAUZE/BANDAGES/DRESSINGS) ×3 IMPLANT
SUT ETHILON 3-0 FS-10 30 BLK (SUTURE) ×3
SUT VIC AB 2-0 SH 27 (SUTURE) ×9
SUT VIC AB 2-0 SH 27XBRD (SUTURE) ×3 IMPLANT
SUT VIC AB 3-0 SH 27 (SUTURE) ×3
SUT VIC AB 3-0 SH 27X BRD (SUTURE) ×1 IMPLANT
SUTURE EHLN 3-0 FS-10 30 BLK (SUTURE) ×1 IMPLANT

## 2018-12-07 NOTE — Discharge Instructions (Addendum)
NEUROSURGERY DISCHARGE INSTRUCTIONS  The following are instructions to help in your recovery once you have been discharged from the hospital. Even if you feel well, it is important that you follow these activity guidelines.  What to do after you leave the hospital:  Recommended diet:  Increase protein intake to promote wound healing. You may return to your usual diet. However, you may experience discomfort when swallowing in the first month after your surgery. This is normal. You may find that softer foods are more comfortable for you to swallow. Be sure to stay hydrated.   Recommended activity: No bending, lifting, or twisting (BLT). Avoid lifting objects heavier than 10 pounds (gallon milk jug). Where possible, avoid household activities that involve lifting, bending, reaching, pushing, or pulling such as laundry, vacuuming, grocery shopping, and childcare. Try to arrange for help from friends and family for these activities while you heal.   Increase physical activity slowly as tolerated. Taking short walks is encouraged, but avoid strenuous exercise. Do not jog, run, bicycle, lift weights, or participate in any other exercises unless specifically allowed by your doctor.   You should not drive until cleared by your doctor.   Until released by your doctor, you should not return to work or school. You should rest at home and let your body heal.   You may shower the day after your surgery. After showering, lightly dab your incision dry. Do not take a tub bath or go swimming until approved by your doctor at your follow-up appointment.   If you smoke, we strongly recommend that you quit. Smoking has been proven to interfere with normal bone healing and will dramatically reduce the success rate of your surgery. Please contact [facility] (800-QUIT-NOW) and use the resources at www.[facility].com for assistance in stopping smoking.   Medications  Do not restart Aspirin until seven days after  surgery  * Do not take anti-inflammatory medications for 3 days after surgery (naproxen [Aleve], ibuprofen [Advil, Motrin], celecoxib [Celebrex], etc.).   You may restart home medications.   Wound Care Instructions  If you have a dressing on your incision, remove it two days after your surgery. Keep your incision area clean and dry.   If you have staples or stitches on your incision, you should have a follow up scheduled for removal. If you do not have staples or stitches, you will have steri-strips (small pieces of surgical tape) or Dermabond glue. The steri-strips/glue should begin to peel away within about a week (it is fine if the steri-strips fall off before then). If the strips are still in place one week after your surgery, you may gently remove them.    Please Report any of the following: Should you experience any of the following, contact us immediately:   New numbness or weakness   Pain that is progressively getting worse, and is not relieved by your pain medication, muscle relaxers, rest, and warm compresses   Bleeding, redness, swelling, pain, or drainage from surgical incision   Chills or flu-like symptoms   Fever greater than 101.0 F (38.3 C)   Inability to eat, drink fluids, or take medications   Problems with bowel or bladder functions   Difficulty breathing or shortness of breath   Warmth, tenderness, or swelling in your calf    Additional Follow up appointments During office hours (Monday-Friday 9 am to 5 pm), please call your physician at [phone] and ask for Berdine Addison.   After hours and weekends, please call the Marketing executive at  [phone] and ask for the Neurosurgery Resident On Call   For a life-threatening emergency, call [facility]   1) The drugs that you were given will stay in your system until tomorrow so for the next 24 hours you should not:  A) Drive an automobile B) Make any legal  decisions C) Drink any alcoholic beverage   2) You may resume regular meals tomorrow.  Today it is better to start with liquids and gradually work up to solid foods.  You may eat anything you prefer, but it is better to start with liquids, then soup and crackers, and gradually work up to solid foods.   3) Please notify your doctor immediately if you have any unusual bleeding, trouble breathing, redness and pain at the surgery site, drainage, fever, or pain not relieved by medication.    4) Additional Instructions:        Please contact your physician with any problems or Same Day Surgery at [phone], Monday through Friday 6 am to 4 pm, or [place] at [facility] number at [phone].

## 2018-12-07 NOTE — Discharge Summary (Signed)
Procedure: Left ulnar nerve decompression Procedure date: 12/07/2018 Diagnosis: Left ulnar neuropathy  History: Rodney [name] is s/p left ulnar nerve decompression for ulnar neuropathy.  POD0: Tolerated procedure well without complication.  Patient evaluated postoperatively still disoriented from anesthesia but able to answer questions and obey commands.  Denies any new left upper extremity pain/numbness/tingling.  Physical Exam: Vitals:   12/07/18 0759  BP: 131/85  Pulse: 66  Resp: 17  Temp: (!) 97 F (36.1 C)  SpO2: 100%   Strength: 4-/5 left grip Skin: Dressing intact, clean and dry.  Data:  No results for input(s): NA, K, CL, CO2, BUN, CREATININE, LABGLOM, GLUCOSE, CALCIUM in the last 168 hours. No results for input(s): AST, ALT, ALKPHOS in the last 168 hours.  Invalid input(s): TBILI   No results for input(s): WBC, HGB, HCT, PLT in the last 168 hours. Recent Labs  Lab 12/03/18 0900  APTT 30  INR 1.0         Other tests/results: No imaging reviewed  Assessment/Plan:  Rodney [name] is POD 0 status post left ulnar decompression for ulnar radiculopathy.  Denies upper extremity pain or any new numbness/tingling.  We will continue postop pain control with tramadol and Tylenol as needed.  He is scheduled to follow-up in clinic in approximately 2 weeks to monitor progress.  Marin Olp PA-C Department of Neurosurgery

## 2018-12-07 NOTE — Op Note (Signed)
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 Brass was seen in clinic on7/7 and found to have worsening lefthandweakness. An EMG/ NCS showedleftulnar neuropathy along with cervical radiculopathy. This was interfering with daily lifestyle. Given failure of conservative management and with upcoming cervical spine surgery, surgery for decompression of the nerve was discussed.The risks of surgery including numbness and weakness in hand, pain, infection, and hematoma were discussed. The patient elected to proceed withsurgery for decompression of the leftulnarnerve. We discussed that given duration of symptoms and multiple etiologies, any recovery of the nerve could be prolonged and not guaranteed.  Procedure The patient was transferred to the operating room. The patient was given preoperative prophylactic IV antibiotics. For anesthesia, general anestheticwas delivered by the anesthesia service andan ET tube placed. The patient was positioned supine, positioned with the leftarm extended resting on an armboard. All pressure points were carefully padded. A TIME OUT was performed   Theleftarm was prepped and draped in standard sterile technique. An incison was planned over the cubital tunnel using medial epicondyle as landmark.Acurvilinearincision was infiltrated with anesthetic. The skin was incised and subcutaneous tissue exposed. A retractor was inserted. The nerve could be palpated, lying in the cubital  tunnel proximally and distally. Using sharp and blunt dissection, the overlying fascia was opened until the nerve was identified. The nerve was followed proximally andno compression seen as it entered the fascia abovewhich was incised. The muscle there was also seen to have some atrophy.The scissors were used to incise the ligamentand muscleoverlying the nerve which was very thick and appeared adhesive to the nerve.A blunt instrument was passed into the arm freely. Next, distally, the nerve was followedand aband overlying the ulnar groove was released and the nerve was seen to be more full. Distally, it was followedas it entered between the overlying muscles. The overlying ligament and muscle was incised to fully free the nerve from compression. At this point, the nerve could be followed completely and seen to be free.The nerve was seen to have some fatty infiltrate and some discoloration, but not atrophied. The wound was irrigated with antibiotic saline solution until clear and hemostasis was meticulously achieved with bipolar electrocautery. The subdermis was closed with 2-0 vicryland 3-0 Vicryl. The skin was closed withDermabond. The incision was dressed in a clean dry sterile dressing.   All sponge counts, needle counts, and instrument counts were correct at the end of the case x 2. All pressure points remained padded throughout the entire case. The patient tolerated the procedure well without any complications and was transferred in stable condition to the PACU. I spoke with the family following the case     ESTIMATED BLOOD LOSS:10 cc  ATTESTATION: I performed the case in its entiretywith assistance of PA, Corrie Mckusick, [place]

## 2018-12-07 NOTE — H&P (Signed)
History & Physical   Date of Service: 12/07/2018    Time: 0954  Chief Complaint: Left hand numbness  History of Present Illness: Rodney [name] was last seen by me a few years ago for ongoing symptoms in the left arm after multiple prior cervical spine surgeries. In the interim, he has undergone surgery with Dr. Aris Lot for foraminotomy posteriorly at the T1 level. His symptoms improved slightly afterwards but now have worsened. He did have a nerve conduction study recently with concern for an ulnar neuropathy also on the left.  He returns and states that he did get some improvement after the last surgery and grip strength but this is worsened again. He denies any right arm or hand symptoms. He does feel like the numbness is worse in the medial fingers on his left hand going up on the arm but the weakness is worse in the grip and fingers. He does have muscle atrophy. He does have pain around the elbow on the left and distally in the arm.  We discussed an ulnar decompression on the left and all associated risks. He is ready to proceed   Past Medical History:  Diagnosis Date  . GERD (gastroesophageal reflux disease)   . Hyperlipidemia   . Hypertension    Past Surgical History:  Procedure Laterality Date  . cyst removal from spine     "about 50 years ago"  . 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 KNEE ARTHROSCOPY Right 08/2011  . Pin put in right hand index finger     Family History  Problem Relation Age of Onset  . No Known Problems Mother   . No Known Problems Father    Social History   Social History  . Marital status: Single    Spouse name: N/A  . Number of children: N/A  . Years of education: N/A   Social History Main Topics  . Smoking status: Former Smoker    Quit date: 08/25/1954  . Smokeless tobacco: Never Used  . Alcohol use 0.0 oz/week  . Drug use: No  . Sexual activity: Not Asked    Other Topics Concern  . None   Social History Narrative  . None     No Known Allergies  Medications: Cannot display prior to admission medications because the patient has not been admitted in this contact.    Review of Systems:  General ROS: Negative Psychological ROS: Negative Ophthalmic ROS: Negative ENT ROS: Negative Hematological and Lymphatic ROS: Negative  Endocrine ROS: Negative Respiratory ROS: Negative Cardiovascular ROS: Negative Gastrointestinal ROS: Negative Genito-Urinary ROS: Negative Musculoskeletal ROS: Negative Neurological ROS: Positive for numbness, weakness, pain in the left hand and arm Dermatological ROS: Negative   Physical Exam: Temp:  [97 F (36.1 C)] 97 F (36.1 C) (07/13 0759) Pulse Rate:  [66] 66 (07/13 0759) Resp:  [17] 17 (07/13 0759) BP: (131)/(85) 131/85 (07/13 0759) SpO2:  [100 %] 100 % (07/13 0759) Weight:  [122.5 kg] 122.5 kg (07/13 0759) Temp (24hrs), Avg:97 F (36.1 C), Min:97 F (36.1 C), Max:97 F (36.1 C)   General appearance: Alert, cooperative, in no acute distress Head: Normocephalic, atraumatic Eyes: Normal, EOM intact Oropharynx: Facemask Neck: Healed anterior cervical incision Ext: No edema in LE bilaterally, left distal extremity does appear cool to touch. There is obvious atrophy on the dorsum side of the thenar region  Neurologic exam:  Mental status: alertness: alert, affect: normal  Speech: fluent and clear  Motor:strength 5 out of 5 and the right upper extremity throughout all motor groups. In the left upper extremity he appears 5 out of 5 in deltoid, 4+ out of 5 and tricep, 5 out of 5 in bicep. He is 4- out of 5 in wrist extension. He is 4- out of 5 in finger flexion. He is 3 out of 5 in APB. He is 3-5 in thumb and finger opposition. He is 2 out of 5 and abductor digiti minimi I Sensory: Decreased to light touch over the medial 3 fingers and hand Gait: normal    Data: Laboratory: 1. EMG/NCS: Abnormal  study. 2. Electrophysiological evidence of chronic, active, left C8 and T1 radiculopathy with evidence of near complete-to-complete denervation.  Electrophysiological evidence of left ulnar neuropathy at the elbow.  Assessment & Plan:  Active Problems:   Left ulnar neuropathy   1. Plan for left ulnar nerve decompression  VTE Prophylaxis: SCDs  Code Status: Full Code  Discharge Planning: Home  Rodney Gauze, MD 12/07/2018

## 2018-12-07 NOTE — Anesthesia Procedure Notes (Signed)
Procedure Name: Intubation Date/Time: 12/07/2018 10:24 AM Performed by: Caryl Asp, CRNA Pre-anesthesia Checklist: Patient identified, Patient being monitored, Timeout performed, Emergency Drugs available and Suction available Patient Re-evaluated:Patient Re-evaluated prior to induction Oxygen Delivery Method: Circle system utilized Preoxygenation: Pre-oxygenation with 100% oxygen Induction Type: IV induction Ventilation: Mask ventilation without difficulty Laryngoscope Size: 3 and McGraph Grade View: Grade II Tube type: Oral Tube size: 7.5 mm Number of attempts: 1 Airway Equipment and Method: Stylet Placement Confirmation: ETT inserted through vocal cords under direct vision,  positive ETCO2 and breath sounds checked- equal and bilateral Secured at: 24 cm Tube secured with: Tape Dental Injury: Teeth and Oropharynx as per pre-operative assessment  Difficulty Due To: Difficulty was anticipated

## 2018-12-07 NOTE — Anesthesia Postprocedure Evaluation (Signed)
Anesthesia Post Note  Patient: Rodney [name]  Procedure(s) Performed: LEFT ULNAR NERVE DECOMPRESSION (Left Arm Upper)  Patient location during evaluation: PACU Anesthesia Type: General Level of consciousness: awake and alert and oriented Pain management: pain level controlled Vital Signs Assessment: post-procedure vital signs reviewed and stable Respiratory status: spontaneous breathing, nonlabored ventilation and respiratory function stable Cardiovascular status: blood pressure returned to baseline and stable Postop Assessment: no signs of nausea or vomiting Anesthetic complications: no     Last Vitals:  Vitals:   12/07/18 1245 12/07/18 1315  BP: 134/66 113/73  Pulse: 83 72  Resp: 20 18  Temp: (!) 36.4 C   SpO2: 97% 98%    Last Pain:  Vitals:   12/07/18 1245  TempSrc: Temporal  PainSc: 0-No pain                 Maricsa Sammons

## 2018-12-07 NOTE — Anesthesia Post-op Follow-up Note (Signed)
Anesthesia QCDR form completed.        

## 2018-12-07 NOTE — Interval H&P Note (Signed)
History and Physical Interval Note:  12/07/2018 9:55 AM  Rodney [name]  has presented today for surgery, with the diagnosis of ulnar neuropathy.  The various methods of treatment have been discussed with the patient and family. After consideration of risks, benefits and other options for treatment, the patient has consented to  Procedure(s): LEFT ULNAR NERVE DECOMPRESSION (Left) as a surgical intervention.  The patient's history has been reviewed, patient examined, no change in status, stable for surgery.  I have reviewed the patient's chart and labs.  Questions were answered to the patient's satisfaction.     [name]

## 2018-12-07 NOTE — Interval H&P Note (Signed)
History and Physical Interval Note:  12/07/2018 10:01 AM  Rodney [name]  has presented today for surgery, with the diagnosis of ulnar neuropathy.  The various methods of treatment have been discussed with the patient and family. After consideration of risks, benefits and other options for treatment, the patient has consented to  Procedure(s): LEFT ULNAR NERVE DECOMPRESSION (Left) as a surgical intervention.  The patient's history has been reviewed, patient examined, no change in status, stable for surgery.  I have reviewed the patient's chart and labs.  Questions were answered to the patient's satisfaction.     [name]

## 2018-12-07 NOTE — Anesthesia Preprocedure Evaluation (Signed)
Anesthesia Evaluation  Patient identified by MRN, date of birth, ID band Patient awake    Reviewed: Allergy & Precautions, NPO status , Patient's Chart, lab work & pertinent test results  History of Anesthesia Complications Negative for: history of anesthetic complications  Airway Mallampati: III  TM Distance: >3 FB Neck ROM: Full    Dental no notable dental hx.    Pulmonary sleep apnea ,    breath sounds clear to auscultation- rhonchi (-) wheezing      Cardiovascular hypertension, Pt. on medications (-) CAD, (-) Past MI, (-) Cardiac Stents and (-) CABG  Rhythm:Regular Rate:Normal - Systolic murmurs and - Diastolic murmurs    Neuro/Psych neg Seizures negative neurological ROS  negative psych ROS   GI/Hepatic negative GI ROS, Neg liver ROS,   Endo/Other  negative endocrine ROSneg diabetes  Renal/GU negative Renal ROS     Musculoskeletal  (+) Arthritis ,   Abdominal (+) + obese,   Peds  Hematology negative hematology ROS (+)   Anesthesia Other Findings Past Medical History: No date: Arthritis No date: Cancer ([place])     Comment:  melanoma No date: DDD (degenerative disc disease), cervical No date: Hypertension No date: Neuromuscular disorder (HCC) No date: Sleep apnea   Reproductive/Obstetrics                             Anesthesia Physical Anesthesia Plan  ASA: II  Anesthesia Plan: General   Post-op Pain Management:    Induction: Intravenous  PONV Risk Score and Plan: 1 and Ondansetron, Dexamethasone and Midazolam  Airway Management Planned: Oral ETT  Additional Equipment:   Intra-op Plan:   Post-operative Plan: Extubation in OR  Informed Consent: I have reviewed the patients History and Physical, chart, labs and discussed the procedure including the risks, benefits and alternatives for the proposed anesthesia with the patient or authorized representative who has  indicated his/her understanding and acceptance.     Dental advisory given  Plan Discussed with: CRNA and Anesthesiologist  Anesthesia Plan Comments:         Anesthesia Quick Evaluation

## 2018-12-07 NOTE — Transfer of Care (Signed)
Immediate Anesthesia Transfer of Care Note  Patient: Rodney [name]  Procedure(s) Performed: LEFT ULNAR NERVE DECOMPRESSION (Left Arm Upper)  Patient Location: PACU  Anesthesia Type:General  Level of Consciousness: sedated  Airway & Oxygen Therapy: Patient Spontanous Breathing and Patient connected to face mask oxygen  Post-op Assessment: Report given to RN, Post -op Vital signs reviewed and stable and Patient moving all extremities X 4  Post vital signs: Reviewed and stable  Last Vitals:  Vitals Value Taken Time  BP    Temp    Pulse 94 12/07/18 1145  Resp 11 12/07/18 1145  SpO2 100 % 12/07/18 1145  Vitals shown include unvalidated device data.  Last Pain:  Vitals:   12/07/18 0759  TempSrc: Tympanic  PainSc: 3          Complications: No apparent anesthesia complications

## 2018-12-08 ENCOUNTER — Encounter: Payer: Self-pay | Admitting: Neurosurgery
